# Patient Record
Sex: Male | Born: 1955 | Race: White | Hispanic: No | Marital: Married
Health system: Southern US, Community
[De-identification: ages and names within clinical notes are randomized; demographics above are authoritative.]

## PROBLEM LIST (undated history)

## (undated) DIAGNOSIS — G8929 Other chronic pain: Secondary | ICD-10-CM

## (undated) DIAGNOSIS — E119 Type 2 diabetes mellitus without complications: Secondary | ICD-10-CM

## (undated) DIAGNOSIS — R45851 Suicidal ideations: Secondary | ICD-10-CM

## (undated) DIAGNOSIS — G20 Parkinson's disease: Secondary | ICD-10-CM

## (undated) DIAGNOSIS — G20A1 Parkinson's disease without dyskinesia, without mention of fluctuations: Secondary | ICD-10-CM

## (undated) DIAGNOSIS — F329 Major depressive disorder, single episode, unspecified: Secondary | ICD-10-CM

## (undated) DIAGNOSIS — I10 Essential (primary) hypertension: Secondary | ICD-10-CM

---

## 2004-10-23 ENCOUNTER — Encounter: Admission: RE | Admit: 2004-10-23 | Discharge: 2004-10-23 | Payer: Self-pay | Admitting: Neurosurgery

## 2004-11-10 ENCOUNTER — Encounter: Admission: RE | Admit: 2004-11-10 | Discharge: 2004-11-10 | Payer: Self-pay | Admitting: Neurosurgery

## 2004-11-24 ENCOUNTER — Inpatient Hospital Stay (HOSPITAL_COMMUNITY): Admission: RE | Admit: 2004-11-24 | Discharge: 2004-11-25 | Payer: Self-pay | Admitting: Neurosurgery

## 2004-12-31 ENCOUNTER — Encounter: Admission: RE | Admit: 2004-12-31 | Discharge: 2004-12-31 | Payer: Self-pay | Admitting: Neurosurgery

## 2005-02-26 ENCOUNTER — Encounter: Admission: RE | Admit: 2005-02-26 | Discharge: 2005-02-26 | Payer: Self-pay | Admitting: Neurosurgery

## 2005-04-29 ENCOUNTER — Encounter: Admission: RE | Admit: 2005-04-29 | Discharge: 2005-04-29 | Payer: Self-pay | Admitting: Neurosurgery

## 2005-06-03 ENCOUNTER — Encounter: Admission: RE | Admit: 2005-06-03 | Discharge: 2005-06-03 | Payer: Self-pay | Admitting: Neurosurgery

## 2005-07-03 ENCOUNTER — Encounter: Admission: RE | Admit: 2005-07-03 | Discharge: 2005-07-03 | Payer: Self-pay | Admitting: Neurosurgery

## 2006-03-10 IMAGING — CT CT RECONSTRUCTION
3 of 8 series · 12 of 33 positions shown, 14 images · IV contrast (agent unspecified)
Comparison: none

CLINICAL DATA: Back pain radiating to the hips and legs, right worse than left.
 DIAGNOSTIC LUMBAR DISK INJECTIONS:
 The procedure was discussed in depth with the patient, including potential risk of infection.  He received Vancomycin intravenously prior to the procedure.  He was placed prone on the fluoroscopic table, and Betadine scrub of the back was performed.  Versed was used prior to and during the procedure.  Skin anesthesia was carried [DATE]% lidocaine.    15 cm 22 gauge Lorenz Jumper were directed into the nuclear regions of the disks at L3-4, L4-5, and L5-S1.  Note that the S1-2 disk is rudimentary.  This level was not studied.  Contrast mixed with Vancomycin was injected using a pressure monitoring syringe.  Spot radiographs were taken.  The patient?s symptoms were recorded.  Following the procedure, the patient was treated with Versed and fentanyl for pain relief and taken to CT in good condition.
 L3-4:  Opening pressure was 15 psi.  The disk accommodated 2 cc of contrast with elevation of intradiskal pressure only to 80 psi.  There is a normal nuclear pattern but there is annular tearing in the left posterolateral direction and in the anterior direction.  There is extension of contrast into the ventral epidural space.  The patient felt low back pain which radiated to the hips, more toward the left.  He felt this was partially concordant.
 L4-5:  Opening pressure was 15 psi.  The disk accommodated 2 cc of contrast with elevation of intradiskal pressure to 80 psi.  There is a normal nuclear pattern but there is annular tearing in the left posterolateral to foraminal direction.  Contrast extends into the ventral epidural space.  Patient felt low back pain and bilateral hip pain.  
 L5-S1:  Opening pressure was 10 psi.  The disk accommodated 2 cc of contrast with elevation of intradiskal pressure only to 40 psi.  This is a diffusely disrupted disk with contrast pervading the disk space and extending throughout extensive posterior annular tears and at the ventral epidural region.  There was severe concordant pain with this injection.

[Series 4: recon 3: l-spine helical · axial · 0.27mm/px · z∈[-63,+28]mm · 4 of 123 slices shown, 5 images]
[im 25/123  soft-tissue]
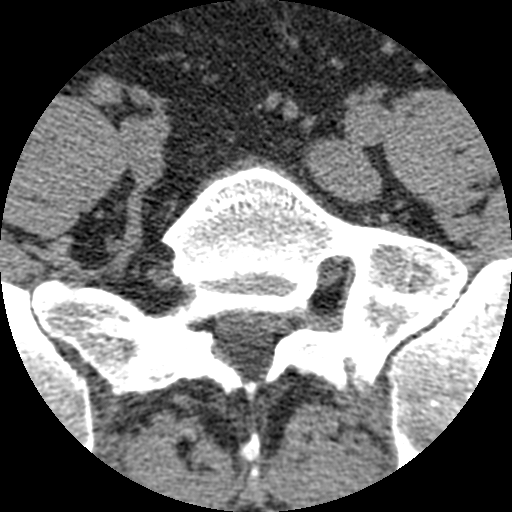
[im 25/123  bone]
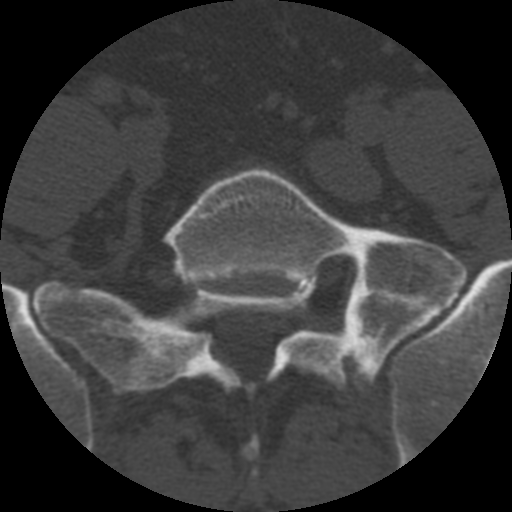
[im 49/123  bone]
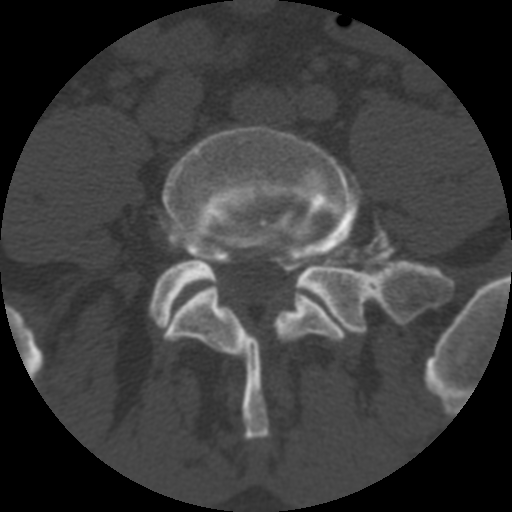
[im 74/123  bone]
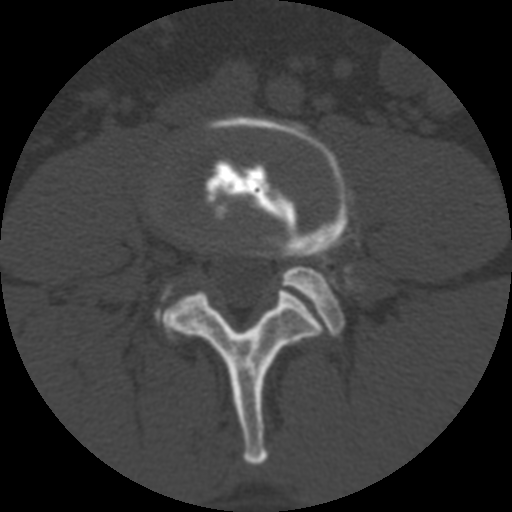
[im 98/123  bone]
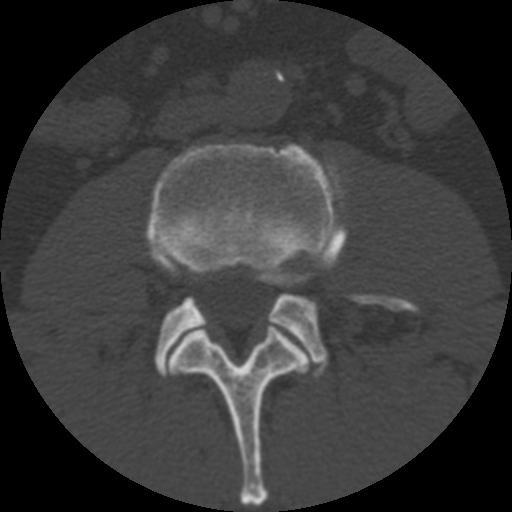

[Series 400: reformatted · sagittal · 0.31mm/px · 5 of 40 slices shown, 6 images (1 of 2)]
[im 14/40  bone]
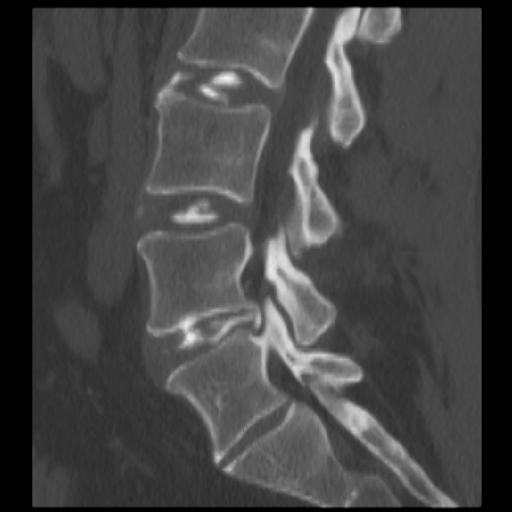
[im 17/40  bone]
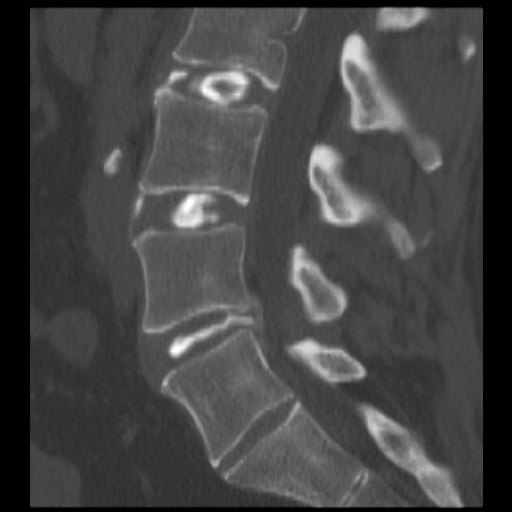
[im 20/40  soft-tissue]
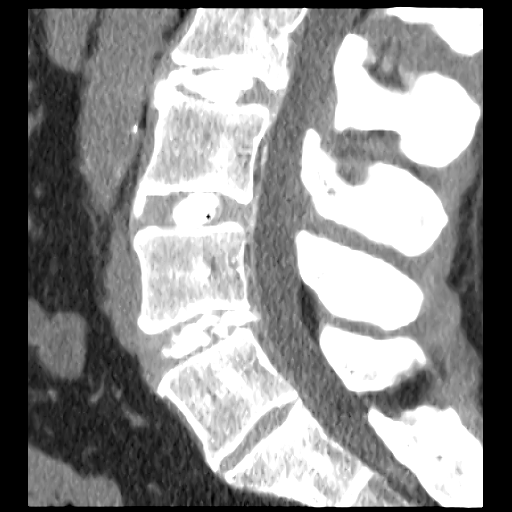
[im 20/40  bone]
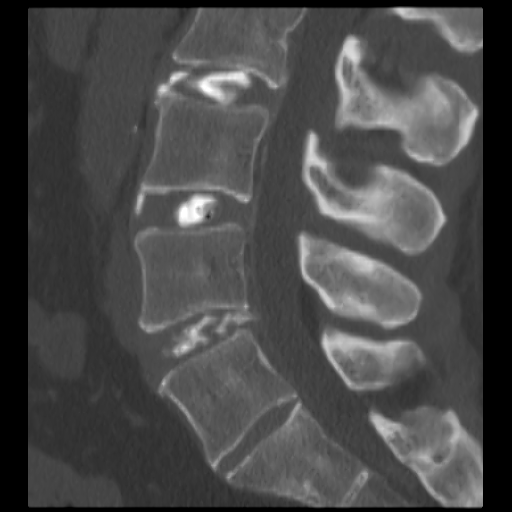
[im 23/40  bone]
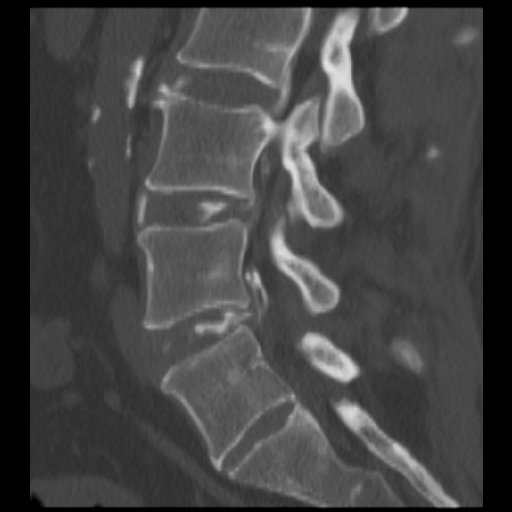
[im 27/40  bone]
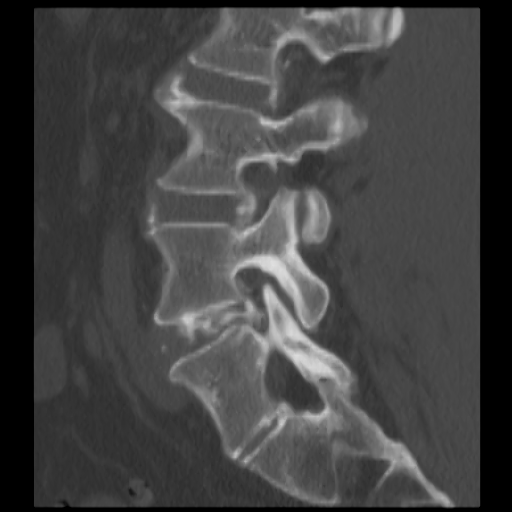

[Series 401: reformatted · coronal · 0.31mm/px · 3 of 40 slices shown (2 of 2)]
[im 8/40  bone]
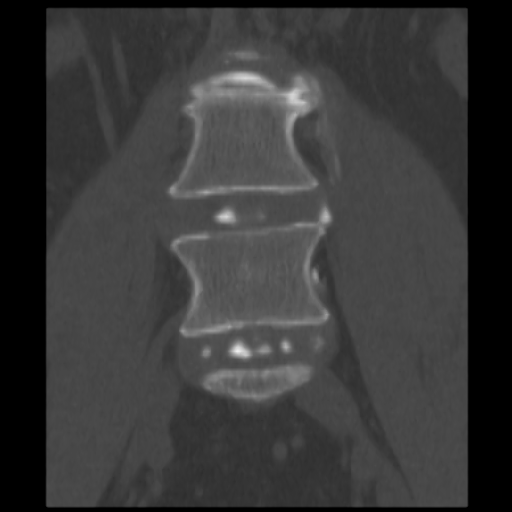
[im 16/40  bone]
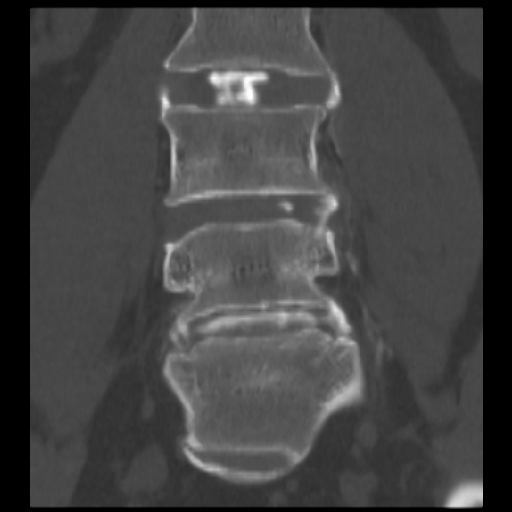
[im 24/40  bone]
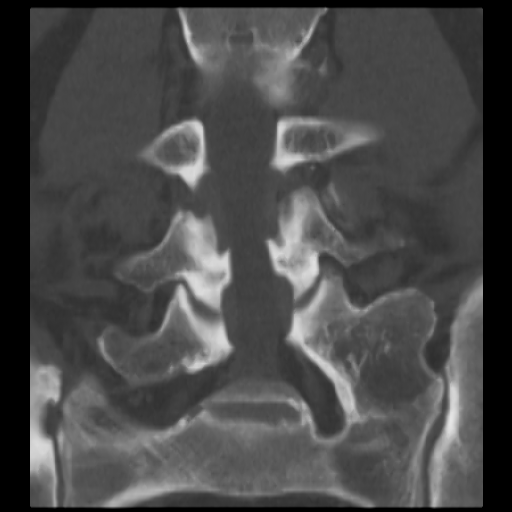

[12 of 33 positions shown; findings below may reference images not displayed]

IMPRESSION: 1.  Abnormal disk morphology at all three levels studied, most severely abnormal at L5-S1 where there is extensive posterior annular tearing.  At L3-4, there is anterior and left sided annular tearing, and at L4-5, there is left posterolateral annular tearing.  
 2.  All three injections resulted in a degree of concordant pain, most severe at L5-S1 but also clearly present at L3-4 and L4-5.
 3.  Note that the S1-2 level is a rudimentary disk space and was not studied.
 POST DISKOGRAM CT SCAN:
 Spiral scanning is performed from L3 to S2. 
 L3-4:  There is contrast in the nuclear region which extends through anterior annular tears and extends around the circumference of the annulus to the ventral epidural space.  Spinal canal appears widely patent.  I do not see what I though were left sided annular tears at diskography, and this must have been contrast tracking around the annulus from the anterior tears.  
 L4-5:  There is a normal nuclear pattern.  There is a left posterolateral annular tear allowing extrusion of contrast into the left foraminal to extraforaminal region with some extension into the ventral epidural space.  There is mild narrowing of the left lateral recess and there is some encroachment upon the foramen on the left.  
 L5-S1:  This is a diffusely disrupted disk with contrast pervading the disk space and extending through posterior annular tears from 4 o'clock to 8 o'clock.  There is some extrusion of injectate and a broad based disk herniation.  There is mild narrowing of the lateral recesses and neural foraminal encroachment bilaterally.
IMPRESSION: 1.  Diffusely disrupted disk at L5-S1 with posterior annular tearing from 4 o'clock to 8 o'clock and extrusion of injectate and disk material.
 2.  Posterolateral annular tearing at L4-5 with extrusion of injectate into the foraminal to extraforaminal region.
 3.  Anterior annular tearing at L3-4 with extrusion of contrast that extends circumferentially around the annulus.
 MULTIPLANAR REFORMATIONS:
 Sagittal, coronal, and paraxial reformations are done which aid in depiction of the above described findings.

## 2008-03-20 ENCOUNTER — Ambulatory Visit: Payer: Self-pay

## 2010-03-12 ENCOUNTER — Ambulatory Visit: Payer: Self-pay | Admitting: Internal Medicine

## 2010-03-17 ENCOUNTER — Emergency Department: Payer: Self-pay | Admitting: Emergency Medicine

## 2011-01-23 ENCOUNTER — Ambulatory Visit: Payer: Self-pay | Admitting: Internal Medicine

## 2014-03-26 ENCOUNTER — Ambulatory Visit: Payer: Self-pay | Admitting: Pain Medicine

## 2019-06-13 ENCOUNTER — Ambulatory Visit: Payer: Medicare Other

## 2019-06-13 ENCOUNTER — Ambulatory Visit
Admission: EM | Admit: 2019-06-13 | Discharge: 2019-06-13 | Disposition: A | Discharge status: Home / Self Care | Payer: Medicare Other | Attending: Family Medicine | Admitting: Family Medicine

## 2019-06-13 ENCOUNTER — Other Ambulatory Visit: Payer: Self-pay

## 2019-06-13 ENCOUNTER — Encounter: Payer: Self-pay | Admitting: Emergency Medicine

## 2019-06-13 DIAGNOSIS — S60221A Contusion of right hand, initial encounter: Secondary | ICD-10-CM | POA: Insufficient documentation

## 2019-06-13 DIAGNOSIS — W28XXXA Contact with powered lawn mower, initial encounter: Secondary | ICD-10-CM

## 2019-06-13 DIAGNOSIS — Z23 Encounter for immunization: Secondary | ICD-10-CM | POA: Diagnosis not present

## 2019-06-13 DIAGNOSIS — S61411A Laceration without foreign body of right hand, initial encounter: Secondary | ICD-10-CM | POA: Insufficient documentation

## 2019-06-13 HISTORY — DX: Major depressive disorder, single episode, unspecified: F32.9

## 2019-06-13 HISTORY — DX: Other chronic pain: G89.29

## 2019-06-13 HISTORY — DX: Essential (primary) hypertension: I10

## 2019-06-13 HISTORY — DX: Type 2 diabetes mellitus without complications: E11.9

## 2019-06-13 HISTORY — DX: Parkinson's disease: G20

## 2019-06-13 HISTORY — DX: Suicidal ideations: R45.851

## 2019-06-13 HISTORY — DX: Parkinson's disease without dyskinesia, without mention of fluctuations: G20.A1

## 2019-06-13 MED ORDER — TETANUS-DIPHTH-ACELL PERTUSSIS 5-2.5-18.5 LF-MCG/0.5 IM SUSP
0.5000 mL | Freq: Once | INTRAMUSCULAR | Status: AC
  Administered 2019-06-13: 0.5 mL via INTRAMUSCULAR

## 2019-06-13 NOTE — Discharge Instructions (Signed)
Keep area clean Apply antibiotic ointment twice daily Follow up if signs/symptoms of infection develop

## 2019-06-13 NOTE — ED Provider Notes (Signed)
[facility]    CSN: [id number] Arrival date & time: 06/13/19  1646     History   Chief Complaint Chief Complaint  Patient presents with  . Extremity Laceration    HPI Daniel Mullins is a 63 y.o. male.   63 yo male with a c/o right hand injury and laceration to the skin after getting a whiplash hit with the hard plastic handle on his lawn mower rope today. States he does not recall his last tetanus immunization.      Past Medical History:  Diagnosis Date  . Chronic pain   . Diabetes type 2, controlled (HCC)   . Hypertension   . Major depression   . Parkinson disease (HCC)   . Suicidal ideation     There are no active problems to display for this patient.        Home Medications    Prior to Admission medications   Medication Sig Start Date End Date Taking? Authorizing Provider  albuterol (VENTOLIN HFA) 108 (90 Base) MCG/ACT inhaler Inhale into the lungs. 11/07/18  Yes [provider]  aspirin EC 325 MG tablet Take 325mg  PO daily for 30 days after surgery to help prevent blood clots. Please obtain over-the-counter. After 30 days you may return to taking 81mg  daily. 07/24/16  Yes [provider]  atorvastatin (LIPITOR) 20 MG tablet Take by mouth. 02/05/16  Yes [provider]  cetirizine (ZYRTEC) 10 MG tablet TAKE 1 TABLET BY MOUTH EVERY DAY AT NIGHT 09/27/18  Yes [provider]  Cholecalciferol (D 5000) 125 MCG (5000 UT) capsule Take by mouth. 02/27/16  Yes [provider]  DULoxetine (CYMBALTA) 60 MG capsule Take by mouth. 05/29/19 05/28/20 Yes [provider]  lisinopril (ZESTRIL) 10 MG tablet Take by mouth. 09/21/18  Yes [provider]  metFORMIN (GLUCOPHAGE) 500 MG tablet Take by mouth. 09/27/18  Yes [provider]  metoprolol succinate (TOPROL-XL) 25 MG 24 hr tablet TAKE 1 TABLET BY MOUTH EVERY DAY 02/22/19  Yes [provider]  oxyCODONE (OXYCONTIN) 20 mg 12 hr tablet  Take by mouth. 03/09/19  Yes [provider]  propranolol (INDERAL) 10 MG tablet Take by mouth. 09/27/18 01/11/20 Yes [provider]  rOPINIRole (REQUIP) 0.5 MG tablet Take by mouth. 05/29/19 05/28/20 Yes [provider]  tiZANidine (ZANAFLEX) 4 MG tablet TAKE 1 TABLET BY MOUTH THREE TIMES A DAY AS NEEDED 05/17/19  Yes [provider]  traZODone (DESYREL) 100 MG tablet Take by mouth. 05/29/19 06/28/19 Yes [provider]  hydrochlorothiazide (HYDRODIURIL) 25 MG tablet Take by mouth.    [provider]  pramipexole (MIRAPEX) 0.125 MG tablet Take by mouth.    [provider]  pregabalin (LYRICA) 50 MG capsule Take by mouth.    [provider]    Family History No family history on file.  Social History Social History   Tobacco Use  . Smoking status: Not on file  Substance Use Topics  . Alcohol use: Not on file  . Drug use: Not on file     Allergies   Patient has no known allergies.   Review of Systems Review of Systems   Physical Exam Triage Vital Signs ED Triage Vitals  Enc Vitals Group     BP 06/13/19 1714 127/69     Pulse Rate 06/13/19 1714 67     Resp 06/13/19 1714 18     Temp 06/13/19 1714 98.1 F (36.7 C)     Temp  Source 06/13/19 1714 Oral     SpO2 06/13/19 1714 97 %     Weight 06/13/19 1711 245 lb (111.1 kg)     Height 06/13/19 1711 5\' 11"  (1.803 m)     Head Circumference --      Peak Flow --      Pain Score 06/13/19 1711 0     Pain Loc --      Pain Edu? --      Excl. in [place]? --    No data found.  Updated Vital Signs BP 127/69 (BP Location: Right Arm)   Pulse 67   Temp 98.1 F (36.7 C) (Oral)   Resp 18   Ht 5\' 11"  (1.803 m)   Wt 111.1 kg   SpO2 97%   BMI 34.17 kg/m   Visual Acuity Right Eye Distance:   Left Eye Distance:   Bilateral Distance:    Right Eye Near:   Left Eye Near:    Bilateral Near:     Physical Exam Vitals signs and nursing note reviewed.  Constitutional:       General: He is not in acute distress.    Appearance: He is not toxic-appearing or diaphoretic.  Musculoskeletal:     Right hand: He exhibits bony tenderness (over the hand (mainly 1st metacarpal)) and laceration (1cm superficial skin flap tear/laceration on dorsum of right hand between thumb and index finger). He exhibits normal range of motion, normal two-point discrimination, normal capillary refill, no deformity and no swelling. Normal sensation noted. Normal strength noted.  Neurological:     Mental Status: He is alert.      UC Treatments / Results  Labs (all labs ordered are listed, but only abnormal results are displayed) Labs Reviewed - No data to display  EKG   Radiology Dg Hand Complete Right  Result Date: 06/13/2019 CLINICAL DATA:  Injury RIGHT hand today trying to crank a chainsaw, struck on back of hand near the base of the second metacarpal close to [place] joint EXAM: RIGHT HAND - COMPLETE 3+ VIEW COMPARISON:  None FINDINGS: Osseous mineralization normal. Degenerative changes with joint space narrowing at the STT joint and midcarpal joint. Mild degenerative changes at IP joint thumb and at DIP joint of index finger as well. No acute fracture, dislocation, or bone destruction. IMPRESSION: Scattered degenerative changes. No acute abnormalities. Electronically Signed   By: Lavonia Dana M.D.   On: 06/13/2019 17:52    Procedures Procedures (including critical care time)  Medications Ordered in UC Medications  Tdap (BOOSTRIX) injection 0.5 mL (0.5 mLs Intramuscular Given 06/13/19 1738)    Initial Impression / Assessment and Plan / UC Course  I have reviewed the triage vital signs and the nursing notes.  Pertinent labs & imaging results that were available during my care of the patient were reviewed by me and considered in my medical decision making (see chart for details).      Final Clinical Impressions(s) / UC Diagnoses   Final diagnoses:  Laceration of right hand  without foreign body, initial encounter  Contusion of right hand, initial encounter     Discharge Instructions     Keep area clean Apply antibiotic ointment twice daily Follow up if signs/symptoms of infection develop    ED Prescriptions    None     1. x-ray result (negative for fracture) and diagnosis reviewed with patient 2. Tdap given 3. Steri-strips applied to superficial skin flap tear on dorsum of hand 4. Recommend supportive treatment with routine  wound care 5. Follow-up prn if symptoms worsen or don't improve   Controlled Substance Prescriptions [place] Controlled Substance Registry consulted? Not Applicable   Payton Mccallumonty, Jamari Moten, MD 06/13/19 [phone]

## 2019-06-13 NOTE — ED Triage Notes (Signed)
Patient c/o laceration to his right hand from a rope on his lawn mower.
